# Patient Record
Sex: Male | Born: 1995 | Race: White | Hispanic: No | Marital: Single | Smoking: Current every day smoker
Health system: Southern US, Community
[De-identification: ages and names within clinical notes are randomized; demographics above are authoritative.]

## PROBLEM LIST (undated history)

## (undated) DIAGNOSIS — F988 Other specified behavioral and emotional disorders with onset usually occurring in childhood and adolescence: Secondary | ICD-10-CM

## (undated) DIAGNOSIS — G039 Meningitis, unspecified: Secondary | ICD-10-CM

---
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
## 2016-04-15 ENCOUNTER — Ambulatory Visit
Admission: EM | Admit: 2016-04-15 | Discharge: 2016-04-15 | Disposition: A | Discharge status: Home / Self Care | Payer: BLUE CROSS/BLUE SHIELD | Attending: Family Medicine | Admitting: Family Medicine

## 2016-04-15 ENCOUNTER — Encounter: Payer: Self-pay | Admitting: *Deleted

## 2016-04-15 DIAGNOSIS — J029 Acute pharyngitis, unspecified: Secondary | ICD-10-CM | POA: Diagnosis not present

## 2016-04-15 HISTORY — DX: Meningitis, unspecified: G03.9

## 2016-04-15 HISTORY — DX: Other specified behavioral and emotional disorders with onset usually occurring in childhood and adolescence: F98.8

## 2016-04-15 LAB — RAPID STREP SCREEN (MED CTR MEBANE ONLY): Streptococcus, Group A Screen (Direct): NEGATIVE

## 2016-04-15 MED ORDER — ACETAMINOPHEN 500 MG PO TABS
1000.0000 mg | ORAL_TABLET | Freq: Four times a day (QID) | ORAL | Status: AC | PRN
  Administered 2016-04-15: 1000 mg via ORAL

## 2016-04-15 MED ORDER — AMOXICILLIN 875 MG PO TABS: 875.0000 mg | ORAL_TABLET | Freq: Two times a day (BID) | ORAL | Status: AC

## 2016-04-15 NOTE — ED Provider Notes (Signed)
[facility]  ____________________________________________  Time seen: Approximately 7:01 PM  I have reviewed the triage vital signs and the nursing notes.   HISTORY  Chief Complaint Sore Throat and Fever    HPI Mitchell Simpson is a 20 y.o. male  presents with a complaint of sore throat with a complaint body aches since yesterday morning. Patient reports that he does have some "stuffy nose "but states he has had intermittently in the last few weeks and states felt to be associated with his seasonal allergies. States he has had several friends at school recently sick with similar. States that he has felt like he had a fever. Denies taking any medications for the same complaints today. Reports continues to eat and drink well with normal appetite. Patient states sore throat is more of a scratchiness and some pain with swallowing.  Denies cough, abdominal pain, rash, chest pain, shortness of breath, neck or back pain, or recent sickness. Denies rash. Denies other complaints.  PCP at [facility] primary   Past Medical History  Diagnosis Date  . ADD (attention deficit disorder)   . Meningitis: as child ([age] year old per patient)     There are no active problems to display for this patient.   History reviewed. No pertinent past surgical history.  Current Outpatient Rx  Name  Route  Sig  Dispense  Refill  . lisdexamfetamine (VYVANSE) 50 MG capsule   Oral   Take 50 mg by mouth daily.         .             Allergies Review of patient's allergies indicates no known allergies.  History reviewed. No pertinent family history.  Social History Social History  Substance Use Topics  . Smoking status: Current Every Day Smoker  . Smokeless tobacco: Never Used  . Alcohol Use: No    Review of Systems Constitutional: As above Eyes: No visual changes. ENT: Positive sore throat Cardiovascular: Denies chest pain. Respiratory: Denies shortness of breath. Gastrointestinal: No  abdominal pain.  No nausea, no vomiting.  No diarrhea.  No constipation. Genitourinary: Negative for dysuria. Musculoskeletal: Negative for back pain. Skin: Negative for rash. Neurological: Negative for headaches, focal weakness or numbness.  10-point ROS otherwise negative.  ____________________________________________   PHYSICAL EXAM:  VITAL SIGNS: ED Triage Vitals  Enc Vitals Group     BP 04/15/16 1844 132/83 mmHg     Pulse Rate 04/15/16 1844 110     Resp 04/15/16 1844 18     Temp 04/15/16 1844 100.7 F (38.2 C)     Temp Source 04/15/16 1844 Oral     SpO2 04/15/16 1844 100 %     Weight 04/15/16 1844 203 lb (92.08 kg)     Height 04/15/16 1844  (1.803 m)     Head Cir --      Peak Flow --      Pain Score 04/15/16 1853 0     Pain Loc --      Pain Edu? --      Excl. in GC? --     Constitutional: Alert and oriented. Well appearing and in no acute distress. Eyes: Conjunctivae are normal. PERRL. EOMI. Head: Atraumatic.No sinus tenderness to palpation in its own. No erythema.  Ears: no erythema, normal TMs bilaterally.   Nose: No congestion/rhinnorhea.  Mouth/Throat: Mucous membranes are moist.  Moderate pharyngeal erythema. 2+ bilateral tonsillar swelling with bilateral tonsillar exudate. No uvular shift or deviation. Neck: No stridor.  No cervical spine tenderness to palpation. Hematological/Lymphatic/Immunilogical: Mild anterior cervical lymphadenopathy. Cardiovascular: Normal rate, regular rhythm. Grossly normal heart sounds.  Good peripheral circulation. Respiratory: Normal respiratory effort.  No retractions. Lungs CTAB. No wheezes, rales or rhonchi. Gastrointestinal: Soft and nontender. No distention. Normal Bowel sounds.   No hepato-splenomegaly palpated. Musculoskeletal: No lower or upper extremity tenderness nor edema.   Neurologic:  Normal speech and language. No gross focal neurologic deficits are appreciated. No gait instability. Skin:  Skin is warm, dry and  intact. No rash noted. Psychiatric: Mood and affect are normal. Speech and behavior are normal.  ____________________________________________   LABS (all labs ordered are listed, but only abnormal results are displayed)  Labs Reviewed  RAPID STREP SCREEN (NOT AT [facility])  CULTURE, GROUP A STREP [facility]   ____________________________________________   INITIAL IMPRESSION / ASSESSMENT AND PLAN / ED COURSE  Pertinent labs & imaging results that were available during my care of the patient were reviewed by me and considered in my medical decision making (see chart for details).  Very well-appearing patient. No acute distress. Sore throat and body aches since yesterday. Patient febrile at this time. 1 g oral Tylenol given once in urgent care. Patient drinking fluids well at bedside. Moderate pharyngeal erythema with bilateral tonsillar swelling and exudate. Suspect streptococcal pharyngitis. Quick strep negative, will culture. Patient informed of this. However again suspect streptococcal pharyngitis. Will initiate treatment with oral amoxicillin. Encourage rest, fluids, over-the-counter Tylenol or ibuprofen as needed. School note given today. 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 Encouraged patient to follow-up with primary care physician for continued complaints.  Discussed follow up with Primary care physician this week. Discussed follow up and return parameters including no resolution or any worsening concerns. Patient verbalized understanding and agreed to plan.   ____________________________________________   FINAL CLINICAL IMPRESSION(S) / ED DIAGNOSES  Final diagnoses:  Pharyngitis      Note: This dictation was prepared with Dragon dictation along with smaller phrase technology. Any transcriptional errors that result from this process are unintentional.    Renford DillsLindsey Kylee Umana, NP 04/15/16 1921

## 2016-04-15 NOTE — Discharge Instructions (Signed)
Take medication as prescribed. Rest. Drink plenty of fluids. Take over the counter tylenol or ibuprofen as needed for pain or fever.   Follow up with your primary care physician this week as needed. Return to Urgent care for new or worsening concerns.    Pharyngitis Pharyngitis is redness, pain, and swelling (inflammation) of your pharynx.  CAUSES  Pharyngitis is usually caused by infection. Most of the time, these infections are from viruses (viral) and are part of a cold. However, sometimes pharyngitis is caused by bacteria (bacterial). Pharyngitis can also be caused by allergies. Viral pharyngitis may be spread from person to person by coughing, sneezing, and personal items or utensils (cups, forks, spoons, toothbrushes). Bacterial pharyngitis may be spread from person to person by more intimate contact, such as kissing.  SIGNS AND SYMPTOMS  Symptoms of pharyngitis include:   Sore throat.   Tiredness (fatigue).   Low-grade fever.   Headache.  Joint pain and muscle aches.  Skin rashes.  Swollen lymph nodes.  Plaque-like film on throat or tonsils (often seen with bacterial pharyngitis). DIAGNOSIS  Your health care provider will ask you questions about your illness and your symptoms. Your medical history, along with a physical exam, is often all that is needed to diagnose pharyngitis. Sometimes, a rapid strep test is done. Other lab tests may also be done, depending on the suspected cause.  TREATMENT  Viral pharyngitis will usually get better in 3-4 days without the use of medicine. Bacterial pharyngitis is treated with medicines that kill germs (antibiotics).  HOME CARE INSTRUCTIONS   Drink enough water and fluids to keep your urine clear or pale yellow.   Only take over-the-counter or prescription medicines as directed by your health care provider:   If you are prescribed antibiotics, make sure you finish them even if you start to feel better.   Do not take aspirin.    Get lots of rest.   Gargle with 8 oz of salt water ( tsp of salt per 1 qt of water) as often as every 1-2 hours to soothe your throat.   Throat lozenges (if you are not at risk for choking) or sprays may be used to soothe your throat. SEEK MEDICAL CARE IF:   You have large, tender lumps in your neck.  You have a rash.  You cough up green, yellow-brown, or bloody spit. SEEK IMMEDIATE MEDICAL CARE IF:   Your neck becomes stiff.  You drool or are unable to swallow liquids.  You vomit or are unable to keep medicines or liquids down.  You have severe pain that does not go away with the use of recommended medicines.  You have trouble breathing (not caused by a stuffy nose). MAKE SURE YOU:   Understand these instructions.  Will watch your condition.  Will get help right away if you are not doing well or get worse.   This information is not intended to replace advice given to you by your health care provider. Make sure you discuss any questions you have with your health care provider.   Document Released: 12/05/2005 Document Revised: 09/25/2013 Document Reviewed: 08/12/2013 Elsevier Interactive Patient Education 2016 [employer]  Rapid Strep Test Strep throat is a bacterial infection caused by the bacteria Streptococcus pyogenes. A rapid strep test is the quickest way to check if these bacteria are causing your sore throat. The test can be done at your health care provider's office. Results are usually ready in 10-20 minutes. You may have this test if  you have symptoms of strep throat. These include:   A red throat with yellow or white spots.  Neck swelling and tenderness.  Fever.  Loss of appetite.  Trouble breathing or swallowing.  Rash.  Dehydration. This test requires a sample of fluid from the back of your throat and tonsils. Your health care provider may hold down your tongue with a tongue depressor and use a swab to collect the sample.  Your health care  provider may collect a second sample at the same time. The second sample may be used for a throat culture. In a culture test, the sample is combined with a substance that encourages bacteria to grow. It takes longer to get the results of the throat culture test, but they are more accurate. They can confirm the results from a rapid strep test, or show that those results were wrong. RESULTS  It is your responsibility to obtain your test results. Ask the lab or department performing the test when and how you will get your results. Contact your health care provider to discuss any questions you have about your results.  The results of the rapid strep test will be negative or positive.  Meaning of Negative Test Results If the result of your rapid strep test is negative, then it means:   It is likely that you do not have strep throat.  A virus may be causing your sore throat. Your health care provider may do a throat culture to confirm the results of the rapid strep test. The throat culture can also identify the different strains of strep bacteria. Meaning of Positive Test Results If the result of your rapid strep test is positive, then it means:  It is likely that you do have strep throat.  You may have to take antibiotics. Your health care provider may do a throat culture to confirm the results of the rapid strep test. Strep throat usually requires a course of antibiotics.    This information is not intended to replace advice given to you by your health care provider. Make sure you discuss any questions you have with your health care provider.   Document Released: 01/12/2005 Document Revised: 12/26/2014 Document Reviewed: 03/13/2014 Elsevier Interactive Patient Education [employer]

## 2016-04-15 NOTE — ED Notes (Signed)
Patient awoke yesterday AM with a sore throat, fever, and nasal congestion. Symptoms have persisted even while taking OTC medications.

## 2016-04-18 LAB — CULTURE, GROUP A STREP (THRC)
